# Patient Record
Sex: Female | Born: 1985 | Hispanic: No | Marital: Married | Smoking: Never smoker
Health system: Southern US, Community
[De-identification: ages and names within clinical notes are randomized; demographics above are authoritative.]

## PROBLEM LIST (undated history)

## (undated) ENCOUNTER — Inpatient Hospital Stay (HOSPITAL_COMMUNITY): Payer: Self-pay

## (undated) DIAGNOSIS — Z789 Other specified health status: Secondary | ICD-10-CM

## (undated) HISTORY — PX: NO PAST SURGERIES: SHX2092

---

## 2011-08-21 ENCOUNTER — Other Ambulatory Visit (HOSPITAL_COMMUNITY)
Admission: RE | Admit: 2011-08-21 | Discharge: 2011-08-21 | Disposition: A | Discharge status: Home / Self Care | Payer: Medicaid Other | Source: Ambulatory Visit | Attending: Obstetrics & Gynecology | Admitting: Obstetrics & Gynecology

## 2011-08-21 DIAGNOSIS — Z124 Encounter for screening for malignant neoplasm of cervix: Secondary | ICD-10-CM | POA: Insufficient documentation

## 2011-08-21 DIAGNOSIS — Z3480 Encounter for supervision of other normal pregnancy, unspecified trimester: Secondary | ICD-10-CM | POA: Insufficient documentation

## 2011-08-21 DIAGNOSIS — Z113 Encounter for screening for infections with a predominantly sexual mode of transmission: Secondary | ICD-10-CM | POA: Insufficient documentation

## 2011-12-14 LAB — OB RESULTS CONSOLE GC/CHLAMYDIA: Chlamydia: NEGATIVE

## 2011-12-14 LAB — OB RESULTS CONSOLE RPR: RPR: NONREACTIVE

## 2011-12-14 LAB — OB RESULTS CONSOLE ABO/RH: RH Type: POSITIVE

## 2011-12-14 LAB — OB RESULTS CONSOLE HIV ANTIBODY (ROUTINE TESTING): HIV: NONREACTIVE

## 2012-02-05 ENCOUNTER — Inpatient Hospital Stay (HOSPITAL_COMMUNITY): Payer: Medicaid Other

## 2012-02-05 ENCOUNTER — Encounter (HOSPITAL_COMMUNITY): Payer: Self-pay | Admitting: *Deleted

## 2012-02-05 ENCOUNTER — Inpatient Hospital Stay (HOSPITAL_COMMUNITY)
Admission: AD | Admit: 2012-02-05 | Discharge: 2012-02-05 | Disposition: A | Discharge status: Home / Self Care | Payer: Medicaid Other | Source: Ambulatory Visit | Attending: Obstetrics | Admitting: Obstetrics

## 2012-02-05 DIAGNOSIS — O368190 Decreased fetal movements, unspecified trimester, not applicable or unspecified: Secondary | ICD-10-CM | POA: Insufficient documentation

## 2012-02-05 HISTORY — DX: Other specified health status: Z78.9

## 2012-02-05 NOTE — MAU Note (Signed)
Pt is still in  U/s;

## 2012-02-05 NOTE — MAU Provider Note (Signed)
  History     CSN: [id number]  Arrival date and time: 02/05/12 1147   None     Chief Complaint  Patient presents with  . Decreased Fetal Movement   HPI  pt is here from the office for decreased fetal movement.  Past Medical History  Diagnosis Date  . No pertinent past medical history     Past Surgical History  Procedure Date  . No past surgeries     Family History  Problem Relation Age of Onset  . Other Neg Hx     History  Substance Use Topics  . Smoking status: Never Smoker   . Smokeless tobacco: Not on file  . Alcohol Use: No    Allergies: Allergies not on file  No prescriptions prior to admission    Review of Systems  Constitutional: Negative for fever.  Eyes: Negative for blurred vision.  Gastrointestinal: Negative for nausea, vomiting, abdominal pain, diarrhea and constipation.  Genitourinary: Negative for dysuria, urgency and frequency.  Neurological: Negative for dizziness and headaches.   Physical Exam   Blood pressure 124/68, pulse 85, temperature 97.9 F (36.6 C), resp. rate 16, height 5\' 5"  (1.651 m), weight 69.31 kg (152 lb 12.8 oz).  Physical Exam  Nursing note and vitals reviewed. Constitutional: She is oriented to person, place, and time. She appears well-developed and well-nourished. No distress.  Cardiovascular: Normal rate.   GI: Soft.  Genitourinary:         FHT: 135, moderate variability with 15X15 accels. No Decels Toco: irregular UC's (2 in 20 min). No noticed by patient.   Neurological: She is alert and oriented to person, place, and time.  Skin: Skin is warm and dry.  Psychiatric: She has a normal mood and affect.    MAU Course  Procedures  1228: Spoke with Dr. Clearance Coots, NST reactive. Orders for BPP with AFI and growth.     0206: Spoke with Dr. Clearance Coots, pt is OK for DC home. FU with the office in 1 week.   Assessment and Plan   1. Decreased fetal movement in pregnancy, antepartum    Reassuring antenatal  testing 3rd trimester danger signs reviewed FU with Dr. Clearance Coots in 1 week  Tawnya Crook 02/05/2012, 12:27 PM

## 2012-02-05 NOTE — MAU Note (Signed)
No fetal movement since yesterday, seen @ MD office, FHR was good.  Sent from MD office to MAU.  Mild uc's, denies bleeding or LOF.

## 2012-02-08 ENCOUNTER — Inpatient Hospital Stay (HOSPITAL_COMMUNITY)
Admission: AD | Admit: 2012-02-08 | Discharge: 2012-02-08 | Disposition: A | Discharge status: Home / Self Care | Payer: Medicaid Other | Source: Ambulatory Visit | Attending: Obstetrics | Admitting: Obstetrics

## 2012-02-08 ENCOUNTER — Encounter (HOSPITAL_COMMUNITY): Payer: Self-pay | Admitting: *Deleted

## 2012-02-08 DIAGNOSIS — O4700 False labor before 37 completed weeks of gestation, unspecified trimester: Secondary | ICD-10-CM | POA: Insufficient documentation

## 2012-02-08 DIAGNOSIS — O479 False labor, unspecified: Secondary | ICD-10-CM

## 2012-02-08 NOTE — MAU Provider Note (Signed)
  History     CSN: [id number]  Arrival date and time: 02/08/12 1653   First Provider Initiated Contact with Patient 02/08/12 1824      Chief Complaint  Patient presents with  . Abdominal Pain  . Back Pain   HPI This is a 26 y.o. female at [redacted]w[redacted]d who presents with report of contractions off and on all day. Since 3pm they have been more frequent.   OB History    Grav Para Term Preterm Abortions TAB SAB Ect Mult Living   1               Past Medical History  Diagnosis Date  . No pertinent past medical history     Past Surgical History  Procedure Date  . No past surgeries     Family History  Problem Relation Age of Onset  . Other Neg Hx     History  Substance Use Topics  . Smoking status: Never Smoker   . Smokeless tobacco: Not on file  . Alcohol Use: No    Allergies: No Known Allergies  Prescriptions prior to admission  Medication Sig Dispense Refill  . Prenatal Vit-Fe Fumarate-FA (PRENATAL MULTIVITAMIN) TABS Take 1 tablet by mouth every morning.        ROS See HPI  Physical Exam   Blood pressure 114/79, pulse 106, temperature 97.2 F (36.2 C), temperature source Oral, resp. rate 18.  Physical Exam  Constitutional: She is oriented to person, place, and time. She appears well-developed and well-nourished. No distress.  Cardiovascular: Normal rate.   Respiratory: Effort normal.  GI: Soft. She exhibits no distension and no mass. There is no tenderness. There is no rebound and no guarding.  Genitourinary: Vagina normal and uterus normal. No vaginal discharge found.  Musculoskeletal: Normal range of motion.  Neurological: She is alert and oriented to person, place, and time.  Skin: Skin is warm and dry.  Psychiatric: She has a normal mood and affect.   FHR reactive UCs irregular, some every 5 minutes, some further apart, states are not painful  Cervix long and closed  MAU Course  Procedures   Assessment and Plan  A:  SIUP at [redacted]w[redacted]d       Preterm  contractions with no cervical change  P:  Discussed with Dr Octavia Heir      Offered tocolysis for comfort, but she declined      Labor precautions      Follow with Dr Romana Juniper 02/08/2012, 6:51 PM

## 2012-02-08 NOTE — MAU Note (Signed)
C/O pain that starts in her lower back L side & radiates around to the L side of her abdomen.  Has been constant since 1430.  Pt denies bleeding or LOF.

## 2012-02-12 LAB — OB RESULTS CONSOLE GBS: GBS: NEGATIVE

## 2012-03-14 ENCOUNTER — Encounter (HOSPITAL_COMMUNITY): Payer: Self-pay | Admitting: *Deleted

## 2012-03-14 ENCOUNTER — Telehealth (HOSPITAL_COMMUNITY): Payer: Self-pay | Admitting: *Deleted

## 2012-03-14 NOTE — Telephone Encounter (Signed)
Preadmission screen  

## 2012-03-16 ENCOUNTER — Encounter (HOSPITAL_COMMUNITY): Payer: Self-pay | Admitting: *Deleted

## 2012-03-16 ENCOUNTER — Inpatient Hospital Stay (HOSPITAL_COMMUNITY)
Admission: AD | Admit: 2012-03-16 | Discharge: 2012-03-16 | Disposition: A | Discharge status: Home / Self Care | Payer: Medicaid Other | Source: Ambulatory Visit | Attending: Obstetrics & Gynecology | Admitting: Obstetrics & Gynecology

## 2012-03-16 DIAGNOSIS — O479 False labor, unspecified: Secondary | ICD-10-CM | POA: Insufficient documentation

## 2012-03-16 NOTE — MAU Provider Note (Signed)
27 y.o. G1P0 at [redacted]w[redacted]d here with possible SROM. RN requests exam for r/o ROM only.   O:  Spec: no pooling Fern negative  EFM reactive, TOCO irreg  RN to report exam to Dr. Tamela Oddi for POC

## 2012-03-16 NOTE — MAU Note (Signed)
Been leaking fluid and having vaginal bleeding lighter than a period since yesterday. Started having contractions this morning around 2 am.

## 2012-03-17 ENCOUNTER — Inpatient Hospital Stay (HOSPITAL_COMMUNITY)
Admission: AD | Admit: 2012-03-17 | Discharge: 2012-03-22 | DRG: 765 | Disposition: A | Discharge status: Home / Self Care | Payer: Medicaid Other | Source: Ambulatory Visit | Attending: Obstetrics & Gynecology | Admitting: Obstetrics & Gynecology

## 2012-03-17 ENCOUNTER — Encounter (HOSPITAL_COMMUNITY): Payer: Self-pay | Admitting: *Deleted

## 2012-03-17 DIAGNOSIS — O4290 Premature rupture of membranes, unspecified as to length of time between rupture and onset of labor, unspecified weeks of gestation: Principal | ICD-10-CM | POA: Diagnosis present

## 2012-03-17 DIAGNOSIS — D649 Anemia, unspecified: Secondary | ICD-10-CM | POA: Diagnosis not present

## 2012-03-17 DIAGNOSIS — O8612 Endometritis following delivery: Secondary | ICD-10-CM | POA: Diagnosis not present

## 2012-03-17 DIAGNOSIS — O9903 Anemia complicating the puerperium: Secondary | ICD-10-CM | POA: Diagnosis not present

## 2012-03-17 DIAGNOSIS — Z98891 History of uterine scar from previous surgery: Secondary | ICD-10-CM

## 2012-03-17 LAB — POCT FERN TEST: POCT Fern Test: POSITIVE

## 2012-03-17 LAB — CBC
Hemoglobin: 13.2 g/dL (ref 12.0–15.0)
Platelets: 196 10*3/uL (ref 150–400)
RBC: 4.85 MIL/uL (ref 3.87–5.11)
WBC: 11 10*3/uL — ABNORMAL HIGH (ref 4.0–10.5)

## 2012-03-17 MED ORDER — FLEET ENEMA 7-19 GM/118ML RE ENEM: 1.0000 | ENEMA | RECTAL | Status: DC | PRN

## 2012-03-17 MED ORDER — ACETAMINOPHEN 325 MG PO TABS
650.0000 mg | ORAL_TABLET | ORAL | Status: DC | PRN
  Administered 2012-03-18: 650 mg via ORAL
  Filled 2012-03-17: qty 2

## 2012-03-17 MED ORDER — IBUPROFEN 600 MG PO TABS: 600.0000 mg | ORAL_TABLET | Freq: Four times a day (QID) | ORAL | Status: DC | PRN

## 2012-03-17 MED ORDER — ONDANSETRON HCL 4 MG/2ML IJ SOLN: 4.0000 mg | Freq: Four times a day (QID) | INTRAMUSCULAR | Status: DC | PRN

## 2012-03-17 MED ORDER — OXYTOCIN 40 UNITS IN LACTATED RINGERS INFUSION - SIMPLE MED
1.0000 m[IU]/min | INTRAVENOUS | Status: DC
  Administered 2012-03-17: 2 m[IU]/min via INTRAVENOUS
  Filled 2012-03-17: qty 1000

## 2012-03-17 MED ORDER — OXYTOCIN 40 UNITS IN LACTATED RINGERS INFUSION - SIMPLE MED: 62.5000 mL/h | INTRAVENOUS | Status: DC

## 2012-03-17 MED ORDER — TERBUTALINE SULFATE 1 MG/ML IJ SOLN: 0.2500 mg | Freq: Once | INTRAMUSCULAR | Status: AC | PRN

## 2012-03-17 MED ORDER — LIDOCAINE HCL (PF) 1 % IJ SOLN
30.0000 mL | INTRAMUSCULAR | Status: DC | PRN
  Filled 2012-03-17: qty 30

## 2012-03-17 MED ORDER — CITRIC ACID-SODIUM CITRATE 334-500 MG/5ML PO SOLN
30.0000 mL | ORAL | Status: DC | PRN
  Administered 2012-03-19: 30 mL via ORAL
  Filled 2012-03-17: qty 15

## 2012-03-17 MED ORDER — OXYTOCIN BOLUS FROM INFUSION: 500.0000 mL | INTRAVENOUS | Status: DC

## 2012-03-17 MED ORDER — OXYCODONE-ACETAMINOPHEN 5-325 MG PO TABS: 1.0000 | ORAL_TABLET | ORAL | Status: DC | PRN

## 2012-03-17 MED ORDER — LACTATED RINGERS IV SOLN
INTRAVENOUS | Status: DC
  Administered 2012-03-17 – 2012-03-18 (×3): via INTRAVENOUS

## 2012-03-17 MED ORDER — BUTORPHANOL TARTRATE 2 MG/ML IJ SOLN
2.0000 mg | INTRAMUSCULAR | Status: DC | PRN
  Administered 2012-03-18: 2 mg via INTRAVENOUS
  Filled 2012-03-17: qty 2

## 2012-03-17 MED ORDER — LACTATED RINGERS IV SOLN: 500.0000 mL | INTRAVENOUS | Status: DC | PRN

## 2012-03-17 NOTE — MAU Note (Signed)
Leaking yellow/clear fluid since this morning. Pink spotting since yesterday. Contractions every 30 minutes all day. Positive fetal movement.

## 2012-03-17 NOTE — H&P (Signed)
Rachel Chavez is a 27 y.o. female presenting for rupture of membranes. Maternal Medical History:  Reason for admission: Reason for admission: rupture of membranes.  Fetal activity: Perceived fetal activity is normal.    Prenatal complications: no prenatal complications   OB History    Grav Para Term Preterm Abortions TAB SAB Ect Mult Living   1              Past Medical History  Diagnosis Date  . No pertinent past medical history    Past Surgical History  Procedure Date  . No past surgeries    Family History: family history is negative for Other. Social History:  reports that she has never smoked. She has never used smokeless tobacco. She reports that she does not drink alcohol or use illicit drugs.     Review of Systems  Constitutional: Negative for fever.  Eyes: Negative for blurred vision.  Respiratory: Negative for shortness of breath.   Gastrointestinal: Negative for vomiting.  Skin: Negative for rash.  Neurological: Negative for headaches.    Dilation: Fingertip Effacement (%): 50 Station: Ballotable Exam by:: [employer] portable bedside US Blood pressure 123/77, pulse 100, temperature 97.9 F (36.6 C), temperature source Oral, resp. rate 18, height 5\' 4"  (1.626 m), weight 71.124 kg (156 lb 12.8 oz), SpO2 99.00%. Maternal Exam:  Abdomen: Fetal presentation: vertex  Introitus: Ferning test: positive.  Amniotic fluid character: meconium stained.  Cervix: Cervix evaluated by digital exam.     Fetal Exam Fetal Monitor Review: Variability: moderate (6-25 bpm).   Pattern: accelerations present and no decelerations.    Fetal State Assessment: Category I - tracings are normal.     Physical Exam  Constitutional: She appears well-developed.  HENT:  Head: Normocephalic.  Neck: Neck supple. No thyromegaly present.  Cardiovascular: Normal rate and regular rhythm.   Respiratory: Breath sounds normal.  GI: Soft. Bowel sounds are normal.  Skin: No  rash noted.    Prenatal labs: ABO, Rh: B/Positive/-- (10/17 0000) Antibody: Negative (10/17 0000) Rubella: Immune (10/17 0000) RPR: Nonreactive (10/17 0000)  HBsAg: Negative (10/17 0000)  HIV: Non-reactive (10/17 0000)  GBS: Negative (12/16 0000)   Assessment/Plan: [place] @ [redacted]w[redacted]d.  PROM. ?Meconium staining.  Category I FHT.  Admit Low dose Pitocin per protocol  [name],Maks Cavallero A 03/17/2012, 10:15 PM

## 2012-03-18 ENCOUNTER — Inpatient Hospital Stay (HOSPITAL_COMMUNITY): Admission: RE | Admit: 2012-03-18 | Payer: Medicaid Other | Source: Ambulatory Visit

## 2012-03-18 LAB — TYPE AND SCREEN
ABO/RH(D): B POS
Antibody Screen: NEGATIVE

## 2012-03-18 LAB — RPR: RPR Ser Ql: NONREACTIVE

## 2012-03-18 LAB — ABO/RH: ABO/RH(D): B POS

## 2012-03-18 MED ORDER — LIDOCAINE HCL (PF) 1 % IJ SOLN
INTRAMUSCULAR | Status: DC | PRN
  Administered 2012-03-18 (×2): 5 mL

## 2012-03-18 MED ORDER — TERBUTALINE SULFATE 1 MG/ML IJ SOLN: 0.2500 mg | Freq: Once | INTRAMUSCULAR | Status: AC | PRN

## 2012-03-18 MED ORDER — LACTATED RINGERS IV SOLN
INTRAVENOUS | Status: DC
  Administered 2012-03-19: 09:00:00 via INTRAVENOUS

## 2012-03-18 MED ORDER — EPHEDRINE 5 MG/ML INJ: 10.0000 mg | INTRAVENOUS | Status: DC | PRN

## 2012-03-18 MED ORDER — DIPHENHYDRAMINE HCL 50 MG/ML IJ SOLN: 12.5000 mg | INTRAMUSCULAR | Status: DC | PRN

## 2012-03-18 MED ORDER — GENTAMICIN SULFATE 40 MG/ML IJ SOLN
150.0000 mg | Freq: Three times a day (TID) | INTRAVENOUS | Status: DC
  Administered 2012-03-18 – 2012-03-19 (×2): 150 mg via INTRAVENOUS
  Filled 2012-03-18 (×3): qty 3.75

## 2012-03-18 MED ORDER — SODIUM CHLORIDE 0.9 % IV SOLN
2.0000 g | Freq: Four times a day (QID) | INTRAVENOUS | Status: DC
  Administered 2012-03-18 – 2012-03-22 (×15): 2 g via INTRAVENOUS
  Filled 2012-03-18 (×17): qty 2000

## 2012-03-18 MED ORDER — PHENYLEPHRINE 40 MCG/ML (10ML) SYRINGE FOR IV PUSH (FOR BLOOD PRESSURE SUPPORT)
80.0000 ug | PREFILLED_SYRINGE | INTRAVENOUS | Status: DC | PRN
  Filled 2012-03-18: qty 5

## 2012-03-18 MED ORDER — OXYTOCIN 40 UNITS IN LACTATED RINGERS INFUSION - SIMPLE MED: 1.0000 m[IU]/min | INTRAVENOUS | Status: DC

## 2012-03-18 MED ORDER — LACTATED RINGERS IV SOLN: 500.0000 mL | Freq: Once | INTRAVENOUS | Status: DC

## 2012-03-18 MED ORDER — PHENYLEPHRINE 40 MCG/ML (10ML) SYRINGE FOR IV PUSH (FOR BLOOD PRESSURE SUPPORT): 80.0000 ug | PREFILLED_SYRINGE | INTRAVENOUS | Status: DC | PRN

## 2012-03-18 MED ORDER — FENTANYL 2.5 MCG/ML BUPIVACAINE 1/10 % EPIDURAL INFUSION (WH - ANES)
14.0000 mL/h | INTRAMUSCULAR | Status: DC
  Administered 2012-03-18 – 2012-03-19 (×3): 14 mL/h via EPIDURAL
  Filled 2012-03-18 (×3): qty 125

## 2012-03-18 MED ORDER — EPHEDRINE 5 MG/ML INJ
10.0000 mg | INTRAVENOUS | Status: DC | PRN
  Filled 2012-03-18: qty 4

## 2012-03-18 NOTE — Progress Notes (Signed)
Called Dr. Tamela Oddi to give status update.  Notified of FHR status, Pitocin 16 mu/min, vital signs, and UC pattern.  Order rec'd to D/C Pitocin and then restart at 1750, and for no SVE to be done at present time.Marland Kitchen

## 2012-03-18 NOTE — Progress Notes (Signed)
Rachel Chavez is Chavez 27 y.o. G1P0 at [redacted]w[redacted]d by LMP admitted for rupture of membranes  Subjective: Uncomfortable  Objective: BP 132/81  Pulse 113  Temp 98.2 F (36.8 C) (Oral)  Resp 18  Ht 5\' 4"  (1.626 m)  Wt 71.124 kg (156 lb 12.8 oz)  BMI 26.91 kg/m2  SpO2 99%      FHT:  FHR: 140 bpm, variability: moderate,  accelerations:  Present,  decelerations:  Absent UC:   regular, every 2 minutes SVE:   Dilation: 1.5 Effacement (%): 50 Station: -3 Exam by:: APannell,RN  Labs: Lab Results  Component Value Date   WBC 11.0* 03/17/2012   HGB 13.2 03/17/2012   HCT 38.9 03/17/2012   MCV 80.2 03/17/2012   PLT 196 03/17/2012    Assessment / Plan: Early labor  Labor: see above Preeclampsia:  n/Chavez Fetal Wellbeing:  Category I Pain Control:  an Epidural is planned I/D:  no signs/symptoms of chorioamnionitis Anticipated MOD:  NSVD  Rachel Chavez 03/18/2012, 11:19 AM

## 2012-03-18 NOTE — Anesthesia Procedure Notes (Signed)
Epidural Patient location during procedure: OB Start time: 03/18/2012 11:29 AM  Staffing Anesthesiologist: Angus Seller., Harrell Gave. Performed by: anesthesiologist   Preanesthetic Checklist Completed: patient identified, site marked, surgical consent, pre-op evaluation, timeout performed, IV checked, risks and benefits discussed and monitors and equipment checked  Epidural Patient position: sitting Prep: site prepped and draped and DuraPrep Patient monitoring: continuous pulse ox and blood pressure Approach: midline Injection technique: LOR air and LOR saline  Needle:  Needle type: Tuohy  Needle gauge: 17 G Needle length: 9 cm and 9 Needle insertion depth: 5 cm cm Catheter type: closed end flexible Catheter size: 19 Gauge Catheter at skin depth: 10 cm Test dose: negative  Assessment Events: blood not aspirated, injection not painful, no injection resistance, negative IV test and no paresthesia  Additional Notes Patient identified.  Risk benefits discussed including failed block, incomplete pain control, headache, nerve damage, paralysis, blood pressure changes, nausea, vomiting, reactions to medication both toxic or allergic, and postpartum back pain.  Patient expressed understanding and wished to proceed.  All questions were answered.  Sterile technique used throughout procedure and epidural site dressed with sterile barrier dressing. No paresthesia or other complications noted.The patient did not experience any signs of intravascular injection such as tinnitus or metallic taste in mouth nor signs of intrathecal spread such as rapid motor block. Please see nursing notes for vital signs.

## 2012-03-18 NOTE — Anesthesia Preprocedure Evaluation (Signed)
Anesthesia Evaluation  Patient identified by MRN, date of birth, ID band Patient awake    Reviewed: Allergy & Precautions, H&P , Patient's Chart, lab work & pertinent test results  Airway Mallampati: II TM Distance: >3 FB Neck ROM: full    Dental No notable dental hx.    Pulmonary neg pulmonary ROS,  breath sounds clear to auscultation  Pulmonary exam normal       Cardiovascular negative cardio ROS  Rhythm:regular Rate:Normal     Neuro/Psych negative neurological ROS  negative psych ROS   GI/Hepatic negative GI ROS, Neg liver ROS,   Endo/Other  negative endocrine ROS  Renal/GU negative Renal ROS     Musculoskeletal   Abdominal   Peds  Hematology negative hematology ROS (+)   Anesthesia Other Findings   Reproductive/Obstetrics (+) Pregnancy                           Anesthesia Physical Anesthesia Plan  ASA: II  Anesthesia Plan: Epidural   Post-op Pain Management:    Induction:   Airway Management Planned:   Additional Equipment:   Intra-op Plan:   Post-operative Plan:   Informed Consent: I have reviewed the patients History and Physical, chart, labs and discussed the procedure including the risks, benefits and alternatives for the proposed anesthesia with the patient or authorized representative who has indicated his/her understanding and acceptance.     Plan Discussed with:   Anesthesia Plan Comments:         Anesthesia Quick Evaluation

## 2012-03-18 NOTE — Progress Notes (Signed)
ANTIBIOTIC CONSULT NOTE - INITIAL  Pharmacy Consult for Gentamicin Indication: Maternal temp during labor  No Known Allergies  Patient Measurements: Height: 5\' 4"  (162.6 cm) Weight: 156 lb 12.8 oz (71.124 kg) IBW/kg (Calculated) : 54.7  Adjusted Body Weight: 59.6kg  Vital Signs: Temp: 99.1 F (37.3 C) (01/20 1858) Temp src: Oral (01/20 1858) BP: 118/58 mmHg (01/20 1830) Pulse Rate: 117  (01/20 1830)   Labs:  Basename 03/17/12 2215  WBC 11.0*  HGB 13.2  PLT 196  LABCREA --  CREATININE --   Estimated SCr=0.65 with estimated CrCl= > 143ml/min   Microbiology: No results found for this or any previous visit (from the past 720 hour(s)).  Medical History: Past Medical History  Diagnosis Date  . No pertinent past medical history     Medications:  Ampicillin 2 grams IV q6h Assessment: 27yo F 40+ admitted for ROM now with maternal temp during labor.  Goal of Therapy:  Gentamicin peaks 6-34mcg/ml and trough < 28mcg/ml  Plan:  1. Gentamicin 150mg  IV q8h. 2. If Gentamicin continued postpartum, will draw SCr. 3. Will continue to follow.  Claybon Jabs 03/18/2012,7:00 PM

## 2012-03-19 ENCOUNTER — Inpatient Hospital Stay (HOSPITAL_COMMUNITY): Payer: Medicaid Other | Admitting: Anesthesiology

## 2012-03-19 ENCOUNTER — Encounter (HOSPITAL_COMMUNITY)
Admission: AD | Disposition: A | Discharge status: Home / Self Care | Payer: Self-pay | Source: Ambulatory Visit | Attending: Obstetrics & Gynecology

## 2012-03-19 ENCOUNTER — Encounter (HOSPITAL_COMMUNITY): Payer: Self-pay | Admitting: Anesthesiology

## 2012-03-19 ENCOUNTER — Encounter (HOSPITAL_COMMUNITY): Payer: Self-pay | Admitting: *Deleted

## 2012-03-19 LAB — CREATININE, SERUM
Creatinine, Ser: 0.52 mg/dL (ref 0.50–1.10)
GFR calc non Af Amer: >90 mL/min (ref >90)

## 2012-03-19 SURGERY — Surgical Case
Anesthesia: Regional | Site: Abdomen | Wound class: Clean Contaminated

## 2012-03-19 MED ORDER — NALBUPHINE SYRINGE 5 MG/0.5 ML
5.0000 mg | INJECTION | INTRAMUSCULAR | Status: DC | PRN
  Filled 2012-03-19: qty 1

## 2012-03-19 MED ORDER — MORPHINE SULFATE (PF) 0.5 MG/ML IJ SOLN
INTRAMUSCULAR | Status: DC | PRN
  Administered 2012-03-19: 2000 ug via INTRAVENOUS
  Administered 2012-03-19: 3000 ug via EPIDURAL

## 2012-03-19 MED ORDER — SIMETHICONE 80 MG PO CHEW: 80.0000 mg | CHEWABLE_TABLET | ORAL | Status: DC | PRN

## 2012-03-19 MED ORDER — GENTAMICIN SULFATE 40 MG/ML IJ SOLN
150.0000 mg | Freq: Three times a day (TID) | INTRAVENOUS | Status: AC
  Administered 2012-03-19 – 2012-03-20 (×4): 150 mg via INTRAVENOUS
  Filled 2012-03-19 (×4): qty 3.75

## 2012-03-19 MED ORDER — SCOPOLAMINE 1 MG/3DAYS TD PT72
1.0000 | MEDICATED_PATCH | Freq: Once | TRANSDERMAL | Status: DC
  Administered 2012-03-19: 1.5 mg via TRANSDERMAL

## 2012-03-19 MED ORDER — KETOROLAC TROMETHAMINE 60 MG/2ML IM SOLN
INTRAMUSCULAR | Status: AC
  Administered 2012-03-19: 60 mg via INTRAMUSCULAR
  Filled 2012-03-19: qty 2

## 2012-03-19 MED ORDER — MENTHOL 3 MG MT LOZG: 1.0000 | LOZENGE | OROMUCOSAL | Status: DC | PRN

## 2012-03-19 MED ORDER — ONDANSETRON HCL 4 MG/2ML IJ SOLN: 4.0000 mg | INTRAMUSCULAR | Status: DC | PRN

## 2012-03-19 MED ORDER — TETANUS-DIPHTH-ACELL PERTUSSIS 5-2.5-18.5 LF-MCG/0.5 IM SUSP: 0.5000 mL | Freq: Once | INTRAMUSCULAR | Status: DC

## 2012-03-19 MED ORDER — NALOXONE HCL 1 MG/ML IJ SOLN
1.0000 ug/kg/h | INTRAVENOUS | Status: DC | PRN
  Filled 2012-03-19: qty 2

## 2012-03-19 MED ORDER — MEPERIDINE HCL 25 MG/ML IJ SOLN
INTRAMUSCULAR | Status: DC | PRN
  Administered 2012-03-19 (×2): 12.5 mg via INTRAVENOUS

## 2012-03-19 MED ORDER — DIPHENHYDRAMINE HCL 50 MG/ML IJ SOLN: 25.0000 mg | INTRAMUSCULAR | Status: DC | PRN

## 2012-03-19 MED ORDER — DIPHENHYDRAMINE HCL 50 MG/ML IJ SOLN: 12.5000 mg | INTRAMUSCULAR | Status: DC | PRN

## 2012-03-19 MED ORDER — SODIUM CHLORIDE 0.9 % IJ SOLN: 3.0000 mL | INTRAMUSCULAR | Status: DC | PRN

## 2012-03-19 MED ORDER — LANOLIN HYDROUS EX OINT: 1.0000 "application " | TOPICAL_OINTMENT | CUTANEOUS | Status: DC | PRN

## 2012-03-19 MED ORDER — LIDOCAINE-EPINEPHRINE (PF) 2 %-1:200000 IJ SOLN
INTRAMUSCULAR | Status: AC
  Filled 2012-03-19: qty 20

## 2012-03-19 MED ORDER — KETOROLAC TROMETHAMINE 30 MG/ML IJ SOLN: 30.0000 mg | Freq: Four times a day (QID) | INTRAMUSCULAR | Status: DC | PRN

## 2012-03-19 MED ORDER — AMPICILLIN SODIUM 2 G IJ SOLR: 2.0000 g | Freq: Four times a day (QID) | INTRAMUSCULAR | Status: DC

## 2012-03-19 MED ORDER — HYDROMORPHONE HCL PF 1 MG/ML IJ SOLN: 0.2500 mg | INTRAMUSCULAR | Status: DC | PRN

## 2012-03-19 MED ORDER — SENNOSIDES-DOCUSATE SODIUM 8.6-50 MG PO TABS
2.0000 | ORAL_TABLET | Freq: Every day | ORAL | Status: DC
  Administered 2012-03-19 – 2012-03-21 (×3): 2 via ORAL

## 2012-03-19 MED ORDER — OXYTOCIN 10 UNIT/ML IJ SOLN
40.0000 [IU] | INTRAVENOUS | Status: DC | PRN
  Administered 2012-03-19: 40 [IU] via INTRAVENOUS

## 2012-03-19 MED ORDER — MORPHINE SULFATE 0.5 MG/ML IJ SOLN
INTRAMUSCULAR | Status: AC
  Filled 2012-03-19: qty 10

## 2012-03-19 MED ORDER — OXYTOCIN 10 UNIT/ML IJ SOLN
INTRAMUSCULAR | Status: AC
  Filled 2012-03-19: qty 4

## 2012-03-19 MED ORDER — SODIUM BICARBONATE 8.4 % IV SOLN
INTRAVENOUS | Status: DC | PRN
  Administered 2012-03-19: 5 mL via EPIDURAL

## 2012-03-19 MED ORDER — PHENYLEPHRINE 40 MCG/ML (10ML) SYRINGE FOR IV PUSH (FOR BLOOD PRESSURE SUPPORT)
PREFILLED_SYRINGE | INTRAVENOUS | Status: AC
  Filled 2012-03-19: qty 5

## 2012-03-19 MED ORDER — MEPERIDINE HCL 25 MG/ML IJ SOLN
INTRAMUSCULAR | Status: AC
  Filled 2012-03-19: qty 1

## 2012-03-19 MED ORDER — ONDANSETRON HCL 4 MG/2ML IJ SOLN
INTRAMUSCULAR | Status: DC | PRN
  Administered 2012-03-19: 4 mg via INTRAVENOUS

## 2012-03-19 MED ORDER — PROMETHAZINE HCL 25 MG/ML IJ SOLN: 6.2500 mg | INTRAMUSCULAR | Status: DC | PRN

## 2012-03-19 MED ORDER — IBUPROFEN 600 MG PO TABS
600.0000 mg | ORAL_TABLET | Freq: Four times a day (QID) | ORAL | Status: DC
  Administered 2012-03-19 – 2012-03-22 (×12): 600 mg via ORAL
  Filled 2012-03-19 (×12): qty 1

## 2012-03-19 MED ORDER — SCOPOLAMINE 1 MG/3DAYS TD PT72
MEDICATED_PATCH | TRANSDERMAL | Status: AC
  Administered 2012-03-19: 1.5 mg via TRANSDERMAL
  Filled 2012-03-19: qty 1

## 2012-03-19 MED ORDER — LACTATED RINGERS IV SOLN
INTRAVENOUS | Status: DC
  Administered 2012-03-19 (×2): via INTRAVENOUS

## 2012-03-19 MED ORDER — INFLUENZA VIRUS VACC SPLIT PF IM SUSP
0.5000 mL | INTRAMUSCULAR | Status: AC
  Administered 2012-03-20: 0.5 mL via INTRAMUSCULAR
  Filled 2012-03-19: qty 0.5

## 2012-03-19 MED ORDER — PRENATAL MULTIVITAMIN CH
1.0000 | ORAL_TABLET | Freq: Every day | ORAL | Status: DC
  Administered 2012-03-20 – 2012-03-22 (×3): 1 via ORAL
  Filled 2012-03-19 (×3): qty 1

## 2012-03-19 MED ORDER — ONDANSETRON HCL 4 MG/2ML IJ SOLN: 4.0000 mg | Freq: Three times a day (TID) | INTRAMUSCULAR | Status: DC | PRN

## 2012-03-19 MED ORDER — OXYCODONE-ACETAMINOPHEN 5-325 MG PO TABS
1.0000 | ORAL_TABLET | ORAL | Status: DC | PRN
  Administered 2012-03-19 – 2012-03-21 (×4): 1 via ORAL
  Administered 2012-03-21: 2 via ORAL
  Administered 2012-03-22: 1 via ORAL
  Filled 2012-03-19 (×2): qty 1
  Filled 2012-03-19: qty 2
  Filled 2012-03-19 (×3): qty 1

## 2012-03-19 MED ORDER — KETOROLAC TROMETHAMINE 60 MG/2ML IM SOLN
60.0000 mg | Freq: Once | INTRAMUSCULAR | Status: AC | PRN
  Administered 2012-03-19: 60 mg via INTRAMUSCULAR

## 2012-03-19 MED ORDER — ZOLPIDEM TARTRATE 5 MG PO TABS: 5.0000 mg | ORAL_TABLET | Freq: Every evening | ORAL | Status: DC | PRN

## 2012-03-19 MED ORDER — DIBUCAINE 1 % RE OINT: 1.0000 "application " | TOPICAL_OINTMENT | RECTAL | Status: DC | PRN

## 2012-03-19 MED ORDER — PANTOPRAZOLE SODIUM 20 MG PO TBEC
20.0000 mg | DELAYED_RELEASE_TABLET | Freq: Two times a day (BID) | ORAL | Status: DC
  Administered 2012-03-19 – 2012-03-22 (×2): 20 mg via ORAL
  Filled 2012-03-19 (×8): qty 1

## 2012-03-19 MED ORDER — METOCLOPRAMIDE HCL 5 MG/ML IJ SOLN: 10.0000 mg | Freq: Three times a day (TID) | INTRAMUSCULAR | Status: DC | PRN

## 2012-03-19 MED ORDER — KETOROLAC TROMETHAMINE 30 MG/ML IJ SOLN: 15.0000 mg | Freq: Once | INTRAMUSCULAR | Status: DC | PRN

## 2012-03-19 MED ORDER — ONDANSETRON HCL 4 MG/2ML IJ SOLN
INTRAMUSCULAR | Status: AC
  Filled 2012-03-19: qty 2

## 2012-03-19 MED ORDER — ONDANSETRON HCL 4 MG PO TABS: 4.0000 mg | ORAL_TABLET | ORAL | Status: DC | PRN

## 2012-03-19 MED ORDER — SODIUM BICARBONATE 8.4 % IV SOLN
INTRAVENOUS | Status: AC
  Filled 2012-03-19: qty 50

## 2012-03-19 MED ORDER — 0.9 % SODIUM CHLORIDE (POUR BTL) OPTIME
TOPICAL | Status: DC | PRN
  Administered 2012-03-19: 1000 mL

## 2012-03-19 MED ORDER — MEPERIDINE HCL 25 MG/ML IJ SOLN: 6.2500 mg | INTRAMUSCULAR | Status: DC | PRN

## 2012-03-19 MED ORDER — DIPHENHYDRAMINE HCL 25 MG PO CAPS
25.0000 mg | ORAL_CAPSULE | ORAL | Status: DC | PRN
  Filled 2012-03-19: qty 1

## 2012-03-19 MED ORDER — DIPHENHYDRAMINE HCL 25 MG PO CAPS: 25.0000 mg | ORAL_CAPSULE | Freq: Four times a day (QID) | ORAL | Status: DC | PRN

## 2012-03-19 MED ORDER — WITCH HAZEL-GLYCERIN EX PADS: 1.0000 "application " | MEDICATED_PAD | CUTANEOUS | Status: DC | PRN

## 2012-03-19 MED ORDER — SIMETHICONE 80 MG PO CHEW
80.0000 mg | CHEWABLE_TABLET | Freq: Three times a day (TID) | ORAL | Status: DC
  Administered 2012-03-19 – 2012-03-22 (×12): 80 mg via ORAL

## 2012-03-19 MED ORDER — NALOXONE HCL 0.4 MG/ML IJ SOLN: 0.4000 mg | INTRAMUSCULAR | Status: DC | PRN

## 2012-03-19 MED ORDER — OXYTOCIN 40 UNITS IN LACTATED RINGERS INFUSION - SIMPLE MED: 62.5000 mL/h | INTRAVENOUS | Status: AC

## 2012-03-19 MED ORDER — SIMETHICONE 80 MG PO CHEW: 80.0000 mg | CHEWABLE_TABLET | Freq: Four times a day (QID) | ORAL | Status: DC | PRN

## 2012-03-19 SURGICAL SUPPLY — 41 items
CANISTER WOUND CARE 500ML ATS (WOUND CARE) IMPLANT
CLOTH BEACON ORANGE TIMEOUT ST (SAFETY) ×2 IMPLANT
CONTAINER PREFILL 10% NBF 15ML (MISCELLANEOUS) ×4 IMPLANT
DERMABOND ADVANCED (GAUZE/BANDAGES/DRESSINGS) ×1
DERMABOND ADVANCED .7 DNX12 (GAUZE/BANDAGES/DRESSINGS) ×1 IMPLANT
DRAPE LG THREE QUARTER DISP (DRAPES) ×2 IMPLANT
DRSG OPSITE POSTOP 4X10 (GAUZE/BANDAGES/DRESSINGS) ×2 IMPLANT
DRSG VAC ATS LRG SENSATRAC (GAUZE/BANDAGES/DRESSINGS) IMPLANT
DRSG VAC ATS MED SENSATRAC (GAUZE/BANDAGES/DRESSINGS) IMPLANT
DRSG VAC ATS SM SENSATRAC (GAUZE/BANDAGES/DRESSINGS) IMPLANT
DURAPREP 26ML APPLICATOR (WOUND CARE) ×2 IMPLANT
ELECT REM PT RETURN 9FT ADLT (ELECTROSURGICAL) ×2
ELECTRODE REM PT RTRN 9FT ADLT (ELECTROSURGICAL) ×1 IMPLANT
EXTRACTOR VACUUM M CUP 4 TUBE (SUCTIONS) IMPLANT
GLOVE BIO SURGEON STRL SZ8 (GLOVE) ×4 IMPLANT
GOWN PREVENTION PLUS LG XLONG (DISPOSABLE) ×4 IMPLANT
GOWN PREVENTION PLUS XLARGE (GOWN DISPOSABLE) ×2 IMPLANT
KIT ABG SYR 3ML LUER SLIP (SYRINGE) IMPLANT
NEEDLE HYPO 25X5/8 SAFETYGLIDE (NEEDLE) ×2 IMPLANT
NS IRRIG 1000ML POUR BTL (IV SOLUTION) ×2 IMPLANT
PACK C SECTION WH (CUSTOM PROCEDURE TRAY) ×2 IMPLANT
PAD OB MATERNITY 4.3X12.25 (PERSONAL CARE ITEMS) ×2 IMPLANT
RTRCTR C-SECT PINK 25CM LRG (MISCELLANEOUS) ×2 IMPLANT
SLEEVE SCD COMPRESS KNEE MED (MISCELLANEOUS) IMPLANT
STAPLER VISISTAT 35W (STAPLE) ×2 IMPLANT
SUT GUT PLAIN 0 CT-3 TAN 27 (SUTURE) IMPLANT
SUT MNCRL 0 VIOLET CTX 36 (SUTURE) ×3 IMPLANT
SUT MNCRL AB 4-0 PS2 18 (SUTURE) IMPLANT
SUT MON AB 2-0 CT1 27 (SUTURE) ×2 IMPLANT
SUT MON AB 3-0 SH 27 (SUTURE)
SUT MON AB 3-0 SH27 (SUTURE) IMPLANT
SUT MONOCRYL 0 CTX 36 (SUTURE) ×3
SUT PDS AB 0 CTX 60 (SUTURE) IMPLANT
SUT PLAIN 2 0 XLH (SUTURE) IMPLANT
SUT VIC AB 0 CTX 36 (SUTURE) ×1
SUT VIC AB 0 CTX36XBRD ANBCTRL (SUTURE) ×1 IMPLANT
SUT VIC AB 2-0 CT1 27 (SUTURE)
SUT VIC AB 2-0 CT1 TAPERPNT 27 (SUTURE) IMPLANT
TOWEL OR 17X24 6PK STRL BLUE (TOWEL DISPOSABLE) ×6 IMPLANT
TRAY FOLEY CATH 14FR (SET/KITS/TRAYS/PACK) ×2 IMPLANT
WATER STERILE IRR 1000ML POUR (IV SOLUTION) ×2 IMPLANT

## 2012-03-19 NOTE — Op Note (Signed)
Cesarean Section Procedure Note   Rachel Chavez   03/17/2012 - 03/19/2012  Indications: Arrest of dilatation.   Pre-operative Diagnosis: arrest of dilatation.   Post-operative Diagnosis: Same   Surgeon: Dimitrios Balestrieri A  Assistants: Surgical Technician  Anesthesia: epidural  Procedure Details:  The patient was seen in the Holding Room. The risks, benefits, complications, treatment options, and expected outcomes were discussed with the patient. The patient concurred with the proposed plan, giving informed consent. The patient was identified as Rachel Chavez and the procedure verified as C-Section Delivery. A Time Out was held and the above information confirmed.  After induction of anesthesia, the patient was draped and prepped in the usual sterile manner. A transverse incision was made and carried down through the subcutaneous tissue to the fascia. The fascial incision was made and extended transversely. The fascia was separated from the underlying rectus tissue superiorly and inferiorly. The peritoneum was identified and entered. The peritoneal incision was extended longitudinally. The utero-vesical peritoneal reflection was incised transversely and the bladder flap was bluntly freed from the lower uterine segment. A low transverse uterine incision was made. Delivered from cephalic presentation was a 3630 gram living newborn female infant(s). APGAR (1 MIN): 9   APGAR (5 MINS): 9   APGAR (10 MINS):    A cord ph was not sent. The umbilical cord was clamped and cut cord. A sample was obtained for evaluation. The placenta was removed Intact and appeared normal.  The uterine incision was closed with running locked sutures of 0 Monocryl. A second imbricating layer of the same suture was placed.  Hemostasis was observed. The paracolic gutters were irrigated. The parieto peritoneum was closed in a running fashion with 2-0 Vicryl.  The fascia was then reapproximated with running sutures of 0 Vicryl.  The  skin was closed with staples.  Instrument, sponge, and needle counts were correct prior the abdominal closure and were correct at the conclusion of the case.    Findings:  Normal uterus, ovaries and fallopian tubes.   Estimated Blood Loss:  Total IV Fluids:   Urine Output: 350CC OF clear urine  Specimens: @ORSPECIMEN @   Complications: no complications  Disposition: PACU - hemodynamically stable.  Maternal Condition: stable   Baby condition / location:  nursery-stable    Signed: Surgeon(s): Brock Bad, MD

## 2012-03-19 NOTE — Progress Notes (Signed)
This note also relates to the following rows which could not be included: Dose (milli-units/min) Oxytocin - Cannot attach notes to extension rows Rate (mL/hr) Oxytocin - Cannot attach notes to extension rows Concentration Oxytocin - Cannot attach notes to extension rows   Left message with Dr. Clearance Coots that family is very concerned about baby and are demanding a c-section

## 2012-03-19 NOTE — Anesthesia Postprocedure Evaluation (Signed)
Anesthesia Post Note  Patient: Rachel Chavez  Procedure(s) Performed: Procedure(s) (LRB): CESAREAN SECTION (N/A)  Anesthesia type: Epidural  Patient location: PACU  Post pain: Pain level controlled  Post assessment: Post-op Vital signs reviewed  Last Vitals:  Filed Vitals:   03/19/12 1210  BP: 108/74  Pulse: 102  Temp: 36.7 C  Resp: 20    Post vital signs: Reviewed  Level of consciousness: awake  Complications: No apparent anesthesia complications

## 2012-03-19 NOTE — Transfer of Care (Signed)
Immediate Anesthesia Transfer of Care Note  Patient: Rachel Chavez  Procedure(s) Performed: Procedure(s) (LRB) with comments: CESAREAN SECTION (N/A)  Patient Location: PACU  Anesthesia Type:Epidural  Level of Consciousness: awake, alert  and oriented  Airway & Oxygen Therapy: Patient Spontanous Breathing  Post-op Assessment: Report given to PACU RN and Post -op Vital signs reviewed and stable  Post vital signs: Reviewed and stable  Complications: No apparent anesthesia complications

## 2012-03-19 NOTE — Progress Notes (Signed)
UR chart review completed.  

## 2012-03-19 NOTE — Anesthesia Postprocedure Evaluation (Signed)
  Anesthesia Post-op Note  Patient: Rachel Chavez  Procedure(s) Performed: Procedure(s) (LRB) with comments: CESAREAN SECTION (N/A)  Patient Location: Mother/Baby  Anesthesia Type:Epidural  Level of Consciousness: awake  Airway and Oxygen Therapy: Patient Spontanous Breathing  Post-op Pain: mild  Post-op Assessment: Patient's Cardiovascular Status Stable and Respiratory Function Stable  Post-op Vital Signs: stable  Complications: No apparent anesthesia complications

## 2012-03-19 NOTE — Progress Notes (Signed)
Family frustrated with care, called Dr. Tamela Oddi to report, no cervical change, uc pattern, pitocin rate, vs.  States Dr. Clearance Coots will be in shortly to talk with pt.

## 2012-03-19 NOTE — Progress Notes (Signed)
Aranza Frost is a 27 y.o. G1P0 at [redacted]w[redacted]d by LMP admitted for rupture of membranes  Subjective:   Objective: BP 117/68  Pulse 95  Temp 98.9 F (37.2 C) (Axillary)  Resp 18  Ht 5\' 4"  (1.626 m)  Wt 156 lb 12.8 oz (71.124 kg)  BMI 26.91 kg/m2  SpO2 99% I/O last 3 completed shifts: In: -  Out: 2600 [Urine:2600]    FHT:  FHR: 150-160 bpm, variability: moderate,  accelerations:  Present,  decelerations:  Absent UC:   regular, every 3-4 minutes SVE:   Dilation: 5 Effacement (%): 80 Station: -1 Exam by:: B.Cagna,RN  Labs: Lab Results  Component Value Date   WBC 11.0* 03/17/2012   HGB 13.2 03/17/2012   HCT 38.9 03/17/2012   MCV 80.2 03/17/2012   PLT 196 03/17/2012    Assessment / Plan: Arrest in active phase of labor  Labor: Active Phase.  Arrest of dilatation. Preeclampsia:  n/a Fetal Wellbeing:  Category I Pain Control:  Epidural I/D:  n/a Anticipated MOD:  Cesarean Section.  Yonna Alwin A 03/19/2012, 8:33 AM

## 2012-03-19 NOTE — Addendum Note (Signed)
Addendum  created 03/19/12 1540 by Renford Dills, CRNA   Modules edited:Notes Section

## 2012-03-20 LAB — CBC
MCV: 81.3 fL (ref 78.0–100.0)
Platelets: 144 10*3/uL — ABNORMAL LOW (ref 150–400)
RBC: 3.16 MIL/uL — ABNORMAL LOW (ref 3.87–5.11)
WBC: 12 10*3/uL — ABNORMAL HIGH (ref 4.0–10.5)

## 2012-03-20 MED ORDER — CLINDAMYCIN PHOSPHATE 900 MG/50ML IV SOLN
900.0000 mg | Freq: Three times a day (TID) | INTRAVENOUS | Status: DC
  Administered 2012-03-20: 900 mg via INTRAVENOUS
  Filled 2012-03-20 (×2): qty 50

## 2012-03-20 MED ORDER — DEXTROSE 5 % IV SOLN
Freq: Three times a day (TID) | INTRAVENOUS | Status: DC
  Administered 2012-03-20 – 2012-03-22 (×6): via INTRAVENOUS
  Filled 2012-03-20 (×6): qty 3.75

## 2012-03-20 NOTE — Progress Notes (Signed)
Subjective: Postpartum Day 1: Cesarean Delivery Patient reports no complaints.  Objective: Vital signs in last 24 hours: Temp:  [97.3 F (36.3 C)-100.3 F (37.9 C)] 98 F (36.7 C) (01/22 0415) Pulse Rate:  [77-122] 81  (01/22 0415) Resp:  [16-24] 18  (01/22 0415) BP: (93-125)/(47-80) 93/60 mmHg (01/22 0415) SpO2:  [96 %-100 %] 98 % (01/22 0415)  Physical Exam:  General: alert and no distress Lochia: appropriate Uterine Fundus: firm Incision: healing well DVT Evaluation: No evidence of DVT seen on physical exam.   Basename 03/17/12 2215  HGB 13.2  HCT 38.9    Assessment/Plan: Status post Cesarean section. Doing well postoperatively.  Anemia.  Clinically stable. Continue current care.  HARPER,CHARLES A 03/20/2012, 7:25 AM

## 2012-03-21 ENCOUNTER — Encounter (HOSPITAL_COMMUNITY): Payer: Self-pay | Admitting: Obstetrics

## 2012-03-21 LAB — CREATININE, SERUM
GFR calc Af Amer: >90 mL/min (ref >90)
GFR calc non Af Amer: >90 mL/min (ref >90)

## 2012-03-21 LAB — GENTAMICIN LEVEL, TROUGH: Gentamicin Trough: 1 ug/mL (ref 0.5–2.0)

## 2012-03-21 NOTE — Progress Notes (Signed)
Subjective: Postpartum Day 2: Cesarean Delivery Patient reports incisional pain.    Objective: Vital signs in last 24 hours: Temp:  [97.3 F (36.3 C)-98.2 F (36.8 C)] 97.3 F (36.3 C) (01/23 0538) Pulse Rate:  [65-105] 90  (01/23 0538) Resp:  [18] 18  (01/23 0538) BP: (97-111)/(65-69) 97/67 mmHg (01/23 0538)  Physical Exam:  General: alert and no distress Lochia: appropriate Uterine Fundus: firm, tender Incision: healing well DVT Evaluation: No evidence of DVT seen on physical exam.   Basename 03/20/12 0709  HGB 8.6*  HCT 25.7*    Assessment/Plan: Status post Cesarean section. Doing well postoperatively.                                                                                                                                                                                                                                                                                                                                                                            Mild endometritis, resolving.  Continue antibiotics. Anemia.  Clinically stable.  Iron started. Continue current care.  Craig Ionescu A 03/21/2012, 8:17 AM

## 2012-03-21 NOTE — Progress Notes (Signed)
ANTIBIOTIC CONSULT NOTE - FOLLOW-UP  27 y.o. female G1P1001 S/P C/S at [redacted]w[redacted]d started on ampicillin, gentamicin, and clindamycin for maternal fever/chorioamnionitis and continued postpartum for endometritis. Now pt is PPD#2 and D#3 of antibiotics, afebrile and resolving endometritis. Gentamicin trough and Scr were check today to ensure no toxicity.   Gentamicin trough = 1.0 and Scr = 0.51.   Plan:  Continue current dose of gentamicin. Will defer checking more levels since patient is clinically improving. Will follow-up with MD on planned duration of antibiotics.  Michelene Heady [facility] 03/21/2012 2:12 PM

## 2012-03-22 MED ORDER — AMOXICILLIN-POT CLAVULANATE 875-125 MG PO TABS: 1.0000 | ORAL_TABLET | Freq: Two times a day (BID) | ORAL | Status: AC

## 2012-03-22 MED ORDER — FUSION PLUS PO CAPS: 1.0000 | ORAL_CAPSULE | Freq: Every day | ORAL | Status: AC

## 2012-03-22 MED ORDER — IBUPROFEN 600 MG PO TABS: 600.0000 mg | ORAL_TABLET | Freq: Four times a day (QID) | ORAL | Status: AC | PRN

## 2012-03-22 MED ORDER — PANTOPRAZOLE SODIUM 40 MG PO TBEC: 40.0000 mg | DELAYED_RELEASE_TABLET | Freq: Every day | ORAL | Status: AC | PRN

## 2012-03-22 MED ORDER — OXYCODONE-ACETAMINOPHEN 5-325 MG PO TABS: 1.0000 | ORAL_TABLET | ORAL | Status: AC | PRN

## 2012-03-22 MED ORDER — IRON POLYSACCH CMPLX-B12-FA 150-0.025-1 MG PO CAPS: 1.0000 | ORAL_CAPSULE | Freq: Every day | ORAL | Status: DC

## 2012-03-22 NOTE — Discharge Summary (Signed)
Obstetric Discharge Summary Reason for Admission: induction of labor Prenatal Procedures: ultrasound Intrapartum Procedures: cesarean: low cervical, transverse Postpartum Procedures: antibiotics Complications-Operative and Postpartum: Endometritis. Hemoglobin  Date Value Range Status  03/20/2012 8.6* 12.0 - 15.0 g/dL Final     HCT  Date Value Range Status  03/20/2012 25.7* 36.0 - 46.0 % Final    Physical Exam:  General: alert and no distress Lochia: appropriate Uterine Fundus: firm Incision: healing well DVT Evaluation: No evidence of DVT seen on physical exam.  Discharge Diagnoses: Term Pregnancy-delivered  Discharge Information: Date: 03/22/2012 Activity: pelvic rest Diet: routine Medications: PNV, Ibuprofen, Colace, Iron, Percocet and Augmentin Condition: stable Instructions: refer to practice specific booklet Discharge to: home Follow-up Information    Follow up with HARPER,CHARLES A, MD. Schedule an appointment as soon as possible for a visit in 2 weeks.   Contact information:   [address] [place] [address] [phone]          Newborn Data: Live born female  Birth Weight: 8 lb (3630 g) APGAR: 9, 9  Home with mother.  HARPER,CHARLES A 03/22/2012, 7:41 AM

## 2012-03-22 NOTE — Progress Notes (Signed)
Subjective: Postpartum Day 3: Cesarean Delivery Patient reports less abdominal tenderness.   Objective: Vital signs in last 24 hours: Temp:  [97.9 F (36.6 C)-99.1 F (37.3 C)] 97.9 F (36.6 C) (01/24 0542) Pulse Rate:  [87-108] 96  (01/24 0542) Resp:  [16-18] 16  (01/24 0542) BP: (105-118)/(65-73) 118/73 mmHg (01/24 0542)  Physical Exam:  General: alert and no distress Lochia: appropriate Uterine Fundus: firm, less tender Incision: healing well DVT Evaluation: No evidence of DVT seen on physical exam.   Basename 03/20/12 0709  HGB 8.6*  HCT 25.7*    Assessment/Plan: Status post Cesarean section. Doing well postoperatively.  Endometritis.  Resolving.  Will discharge home on p o antibiotic x 7 days. Anemia.  Clinically stable.  Iron Rx. Discharge home with standard precautions and return to clinic in 2 weeks.  Javana Schey A 03/22/2012, 6:58 AM

## 2013-12-29 ENCOUNTER — Encounter (HOSPITAL_COMMUNITY): Payer: Self-pay | Admitting: Obstetrics

## 2014-04-12 ENCOUNTER — Encounter (HOSPITAL_COMMUNITY): Payer: Self-pay | Admitting: Emergency Medicine

## 2014-04-12 ENCOUNTER — Emergency Department (HOSPITAL_COMMUNITY): Payer: Medicaid Other

## 2014-04-12 ENCOUNTER — Emergency Department (HOSPITAL_COMMUNITY)
Admission: EM | Admit: 2014-04-12 | Discharge: 2014-04-12 | Disposition: A | Discharge status: Home / Self Care | Payer: Medicaid Other | Attending: Emergency Medicine | Admitting: Emergency Medicine

## 2014-04-12 DIAGNOSIS — Z792 Long term (current) use of antibiotics: Secondary | ICD-10-CM | POA: Insufficient documentation

## 2014-04-12 DIAGNOSIS — J111 Influenza due to unidentified influenza virus with other respiratory manifestations: Secondary | ICD-10-CM | POA: Insufficient documentation

## 2014-04-12 DIAGNOSIS — R000 Tachycardia, unspecified: Secondary | ICD-10-CM | POA: Insufficient documentation

## 2014-04-12 DIAGNOSIS — Z79899 Other long term (current) drug therapy: Secondary | ICD-10-CM | POA: Insufficient documentation

## 2014-04-12 DIAGNOSIS — R69 Illness, unspecified: Secondary | ICD-10-CM

## 2014-04-12 LAB — CBC WITH DIFFERENTIAL/PLATELET
BASOS ABS: 0 10*3/uL (ref 0.0–0.1)
Basophils Relative: 0 % (ref 0–1)
Eosinophils Absolute: 0.2 10*3/uL (ref 0.0–0.7)
Eosinophils Relative: 2 % (ref 0–5)
HCT: 38.7 % (ref 36.0–46.0)
Hemoglobin: 12.8 g/dL (ref 12.0–15.0)
LYMPHS PCT: 6 % — AB (ref 12–46)
Lymphs Abs: 0.5 10*3/uL — ABNORMAL LOW (ref 0.7–4.0)
MCH: 26.3 pg (ref 26.0–34.0)
MCHC: 33.1 g/dL (ref 30.0–36.0)
MCV: 79.5 fL (ref 78.0–100.0)
MONO ABS: 0.5 10*3/uL (ref 0.1–1.0)
Monocytes Relative: 5 % (ref 3–12)
NEUTROS PCT: 87 % — AB (ref 43–77)
Neutro Abs: 7.2 10*3/uL (ref 1.7–7.7)
PLATELETS: 263 10*3/uL (ref 150–400)
RBC: 4.87 MIL/uL (ref 3.87–5.11)
RDW: 13.7 % (ref 11.5–15.5)
WBC: 8.3 10*3/uL (ref 4.0–10.5)

## 2014-04-12 LAB — I-STAT CHEM 8, ED
BUN: 4 mg/dL — ABNORMAL LOW (ref 6–23)
CALCIUM ION: 1.15 mmol/L (ref 1.12–1.23)
CREATININE: 0.6 mg/dL (ref 0.50–1.10)
Chloride: 104 mmol/L (ref 96–112)
Glucose, Bld: 141 mg/dL — ABNORMAL HIGH (ref 70–99)
HCT: 42 % (ref 36.0–46.0)
HEMOGLOBIN: 14.3 g/dL (ref 12.0–15.0)
Potassium: 3.4 mmol/L — ABNORMAL LOW (ref 3.5–5.1)
SODIUM: 138 mmol/L (ref 135–145)
TCO2: 17 mmol/L (ref 0–100)

## 2014-04-12 MED ORDER — POTASSIUM CHLORIDE CRYS ER 20 MEQ PO TBCR
40.0000 meq | EXTENDED_RELEASE_TABLET | Freq: Once | ORAL | Status: AC
  Administered 2014-04-12: 40 meq via ORAL
  Filled 2014-04-12: qty 2

## 2014-04-12 NOTE — ED Notes (Signed)
Patient with fever and cough since yesterday.  Patient states that her husband started with symptoms on Friday and now she has them.

## 2014-04-12 NOTE — ED Provider Notes (Signed)
CSN: [id number]     Arrival date & time 04/12/14  2007 History   First MD Initiated Contact with Patient 04/12/14 2035     Chief Complaint  Patient presents with  . Fever  . Cough     (Consider location/radiation/quality/duration/timing/severity/associated sxs/prior Treatment) HPI Complains of nonproductive cough and subjective fever onset yesterday. She treated herself with Tylenol 1.5 hours prior to coming here and feels improved presently. She denies any shortness of breath admits to mild diffuse myalgias. No other associated symptoms. Nothing makes symptoms better or worse. Feels much improved since treatment with Tylenol Past Medical History  Diagnosis Date  . No pertinent past medical history    Past Surgical History  Procedure Laterality Date  . No past surgeries    . Cesarean section  03/19/2012    Procedure: CESAREAN SECTION;  Surgeon: Brock Bad, MD;  Location: [facility];  Service: Obstetrics;  Laterality: N/A;   Family History  Problem Relation Age of Onset  . Other Neg Hx    History  Substance Use Topics  . Smoking status: Never Smoker   . Smokeless tobacco: Never Used  . Alcohol Use: No   OB History    Gravida Para Term Preterm AB TAB SAB Ectopic Multiple Living   Review of Systems  Constitutional: Positive for fever.  HENT: Negative.   Respiratory: Positive for cough.   Cardiovascular: Negative.   Gastrointestinal: Negative.   Musculoskeletal: Negative.   Skin: Negative.   Neurological: Negative.   Psychiatric/Behavioral: Negative.   All other systems reviewed and are negative.     Allergies  Review of patient's allergies indicates no known allergies.  Home Medications   Prior to Admission medications   Medication Sig Start Date End Date Taking? Authorizing Provider  amoxicillin-clavulanate (AUGMENTIN) 875-125 MG per tablet Take 1 tablet by mouth 2 (two) times daily. 03/22/12   Brock Bad, MD  ibuprofen (ADVIL,MOTRIN)  600 MG tablet Take 1 tablet (600 mg total) by mouth every 6 (six) hours as needed for pain. 03/22/12   Brock Bad, MD  Iron-FA-B Cmp-C-Biot-Probiotic (FUSION PLUS) CAPS Take 1 capsule by mouth daily before breakfast. 03/22/12   Brock Bad, MD  oxyCODONE-acetaminophen (PERCOCET/ROXICET) 5-325 MG per tablet Take 1-2 tablets by mouth every 4 (four) hours as needed for pain (moderate - severe pain). 03/22/12   Brock Bad, MD  pantoprazole (PROTONIX) 40 MG tablet Take 1 tablet (40 mg total) by mouth daily as needed. 03/22/12   Brock Bad, MD  Prenatal Vit-Fe Fumarate-FA (PRENATAL MULTIVITAMIN) TABS Take 1 tablet by mouth every morning.    Historical Provider, MD   BP 103/56 mmHg  Pulse 130  Temp(Src) 100.3 F (37.9 C) (Oral)  Resp 18  Ht  (1.6 m)  Wt 142 lb 9.6 oz (64.683 kg)  BMI 25.27 kg/m2  SpO2 98%  LMP 03/10/2014 (Approximate) Physical Exam  Constitutional: She appears well-developed and well-nourished.  HENT:  Head: Normocephalic and atraumatic.  Eyes: Conjunctivae are normal. Pupils are equal, round, and reactive to light.  Neck: Neck supple. No tracheal deviation present. No thyromegaly present.  Cardiovascular: Regular rhythm.   No murmur heard. Pulse mildly tachycardic counted at 116 by me  Pulmonary/Chest: Effort normal and breath sounds normal.  Abdominal: Soft. Bowel sounds are normal. She exhibits no distension. There is no tenderness.  Musculoskeletal: Normal range of motion. She exhibits no edema or  tenderness.  Neurological: She is alert. Coordination normal.  Skin: Skin is warm and dry. No rash noted.  Psychiatric: She has a normal mood and affect.  Nursing note and vitals reviewed.   ED Course  Procedures (including critical care time) Labs Review Labs Reviewed  I-STAT CHEM 8, ED - Abnormal; Notable for the following:    Potassium 3.4 (*)    BUN 4 (*)    Glucose, Bld 141 (*)    All other components within normal limits  CBC WITH  DIFFERENTIAL/PLATELET    Imaging Review No results found.   EKG Interpretation None     Chest x-ray viewed by me Results for orders placed or performed during the hospital encounter of 04/12/14  CBC with Differential  Result Value Ref Range   WBC 8.3 4.0 - 10.5 K/uL   RBC 4.87 3.87 - 5.11 MIL/uL   Hemoglobin 12.8 12.0 - 15.0 g/dL   HCT 16.138.7 09.636.0 - 04.546.0 %   MCV 79.5 78.0 - 100.0 fL   MCH 26.3 26.0 - 34.0 pg   MCHC 33.1 30.0 - 36.0 g/dL   RDW 40.913.7 81.111.5 - 91.415.5 %   Platelets 263 150 - 400 K/uL   Neutrophils Relative % 87 (H) 43 - 77 %   Neutro Abs 7.2 1.7 - 7.7 K/uL   Lymphocytes Relative 6 (L) 12 - 46 %   Lymphs Abs 0.5 (L) 0.7 - 4.0 K/uL   Monocytes Relative 5 3 - 12 %   Monocytes Absolute 0.5 0.1 - 1.0 K/uL   Eosinophils Relative 2 0 - 5 %   Eosinophils Absolute 0.2 0.0 - 0.7 K/uL   Basophils Relative 0 0 - 1 %   Basophils Absolute 0.0 0.0 - 0.1 K/uL  I-Stat Chem 8, ED  Result Value Ref Range   Sodium 138 135 - 145 mmol/L   Potassium 3.4 (L) 3.5 - 5.1 mmol/L   Chloride 104 96 - 112 mmol/L   BUN 4 (L) 6 - 23 mg/dL   Creatinine, Ser 7.820.60 0.50 - 1.10 mg/dL   Glucose, Bld 956141 (H) 70 - 99 mg/dL   Calcium, Ion 2.131.15 0.861.12 - 1.23 mmol/L   TCO2 17 0 - 100 mmol/L   Hemoglobin 14.3 12.0 - 15.0 g/dL   HCT 57.842.0 46.936.0 - 62.946.0 %   Dg Chest 2 View  04/12/2014   CLINICAL DATA:  Fever and cough  EXAM: CHEST  2 VIEW  COMPARISON:  None.  FINDINGS: Lungs are clear. Heart size and pulmonary vascularity are normal. No adenopathy. No bone lesions.  IMPRESSION: No edema or consolidation.   Electronically Signed   By: Bretta BangWilliam  Woodruff III M.D.   On: 04/12/2014 20:51    MDM  Plan Tylenol for discomfort or temperature higher than 100.4. Referral [facility] Diagnosis #1 influenza-like illness #2 hypokalemia Final diagnoses:  None        Doug SouSam Zakyah Yanes, MD 04/12/14 2300

## 2014-04-12 NOTE — ED Notes (Signed)
Pt refused last set of vitals

## 2014-04-12 NOTE — Discharge Instructions (Signed)
Take Tylenol as directed every 4 hours for aches or for temperature higher than 100.4 while awake. Use a humidifier to help with congestion. We suspect that your illness will last for approximately week. Call the [facility] if you're not feeling better in a week. Return if your condition worsens for any reason. Make sure that you drink lots of fluids. Drink at least 68 ounce glasses of water or Gatorade daily. Your blood potassium was slightly low tonight. The potassium tablets that you were given should fix that. Bananas and orange juice containing lots of potassium.

## 2016-05-15 IMAGING — CR DG CHEST 2V
2 series · 2 of 2 positions shown · non-contrast
Comparison: None.

CLINICAL DATA: Fever and cough

EXAM:
CHEST  2 VIEW

[w chest pa]
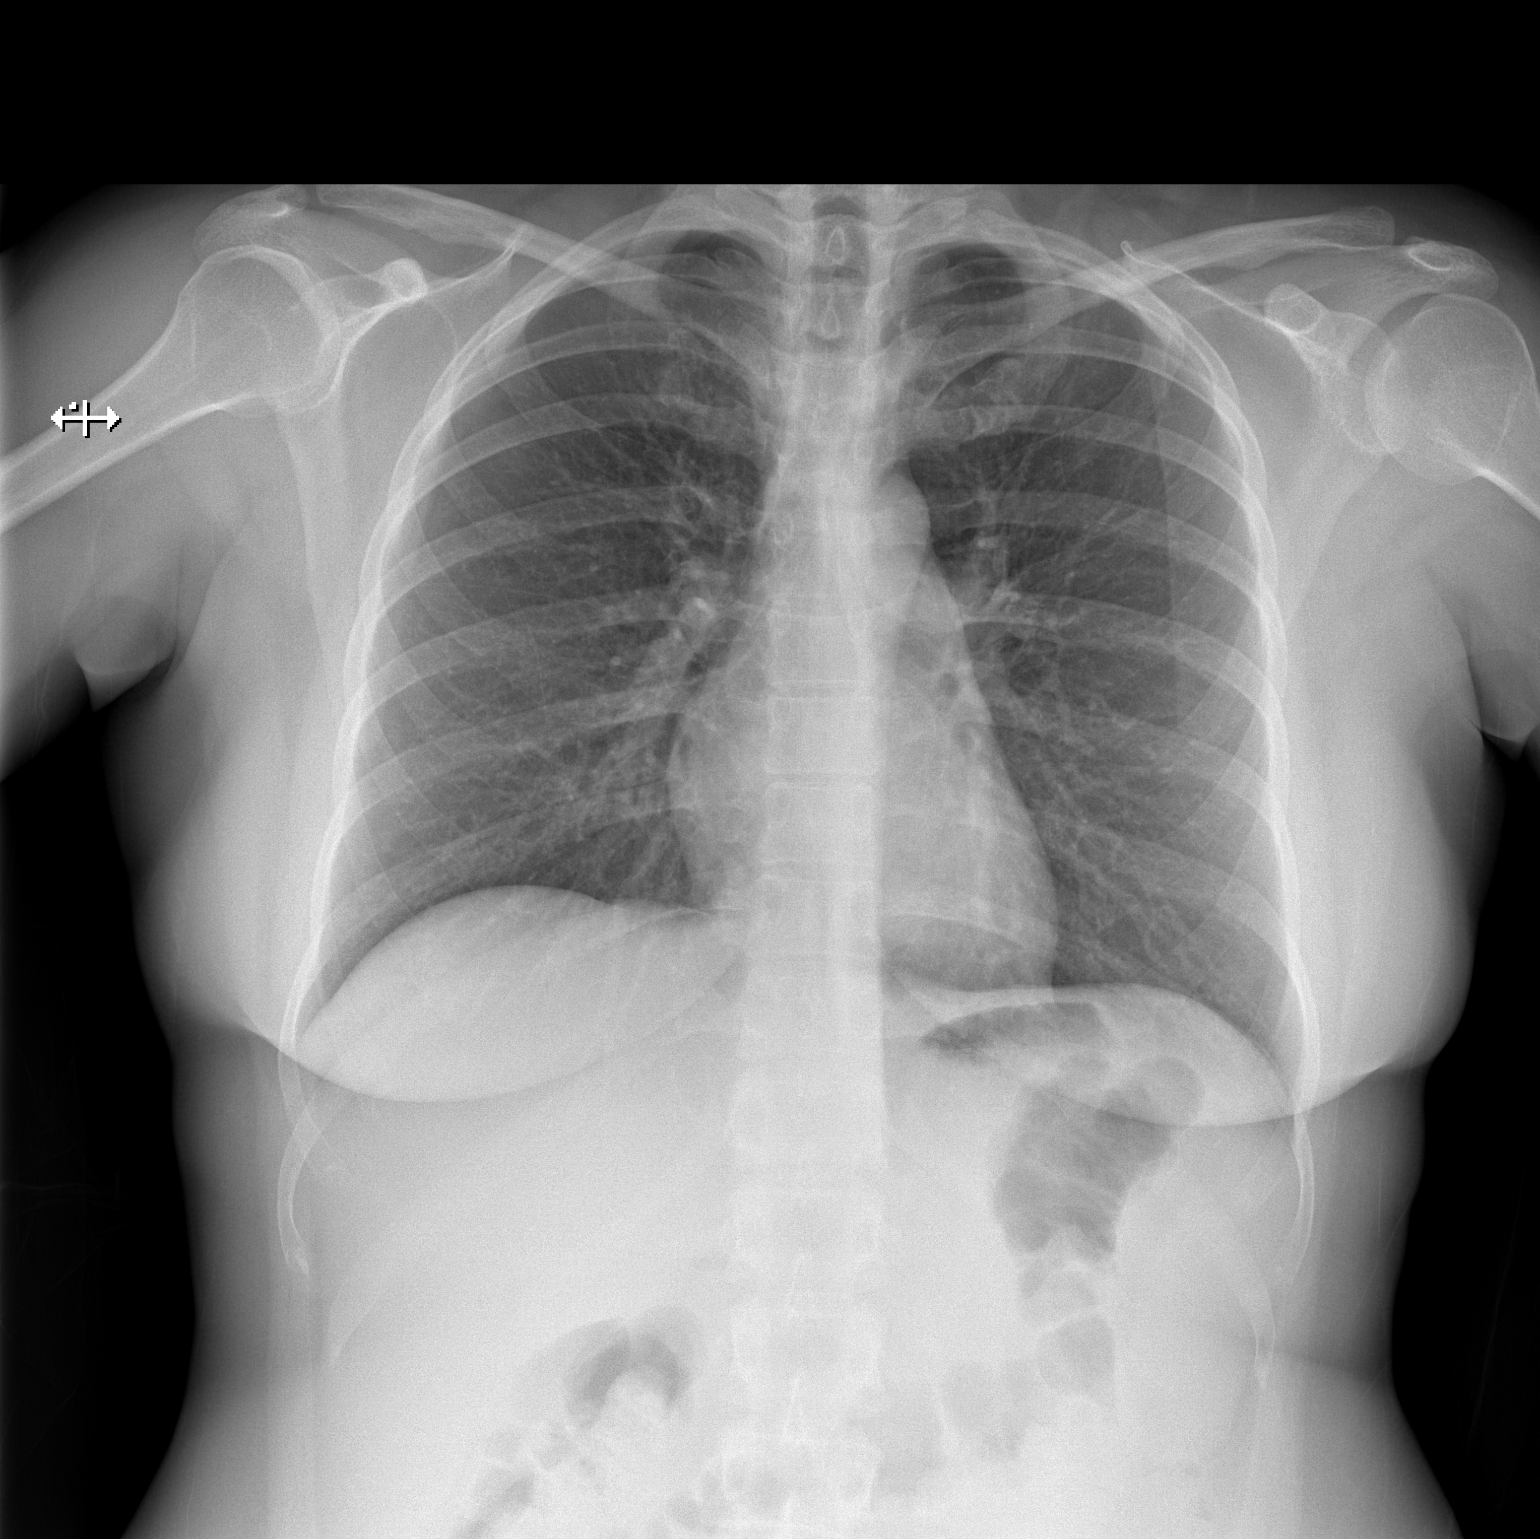

[w chest lat]
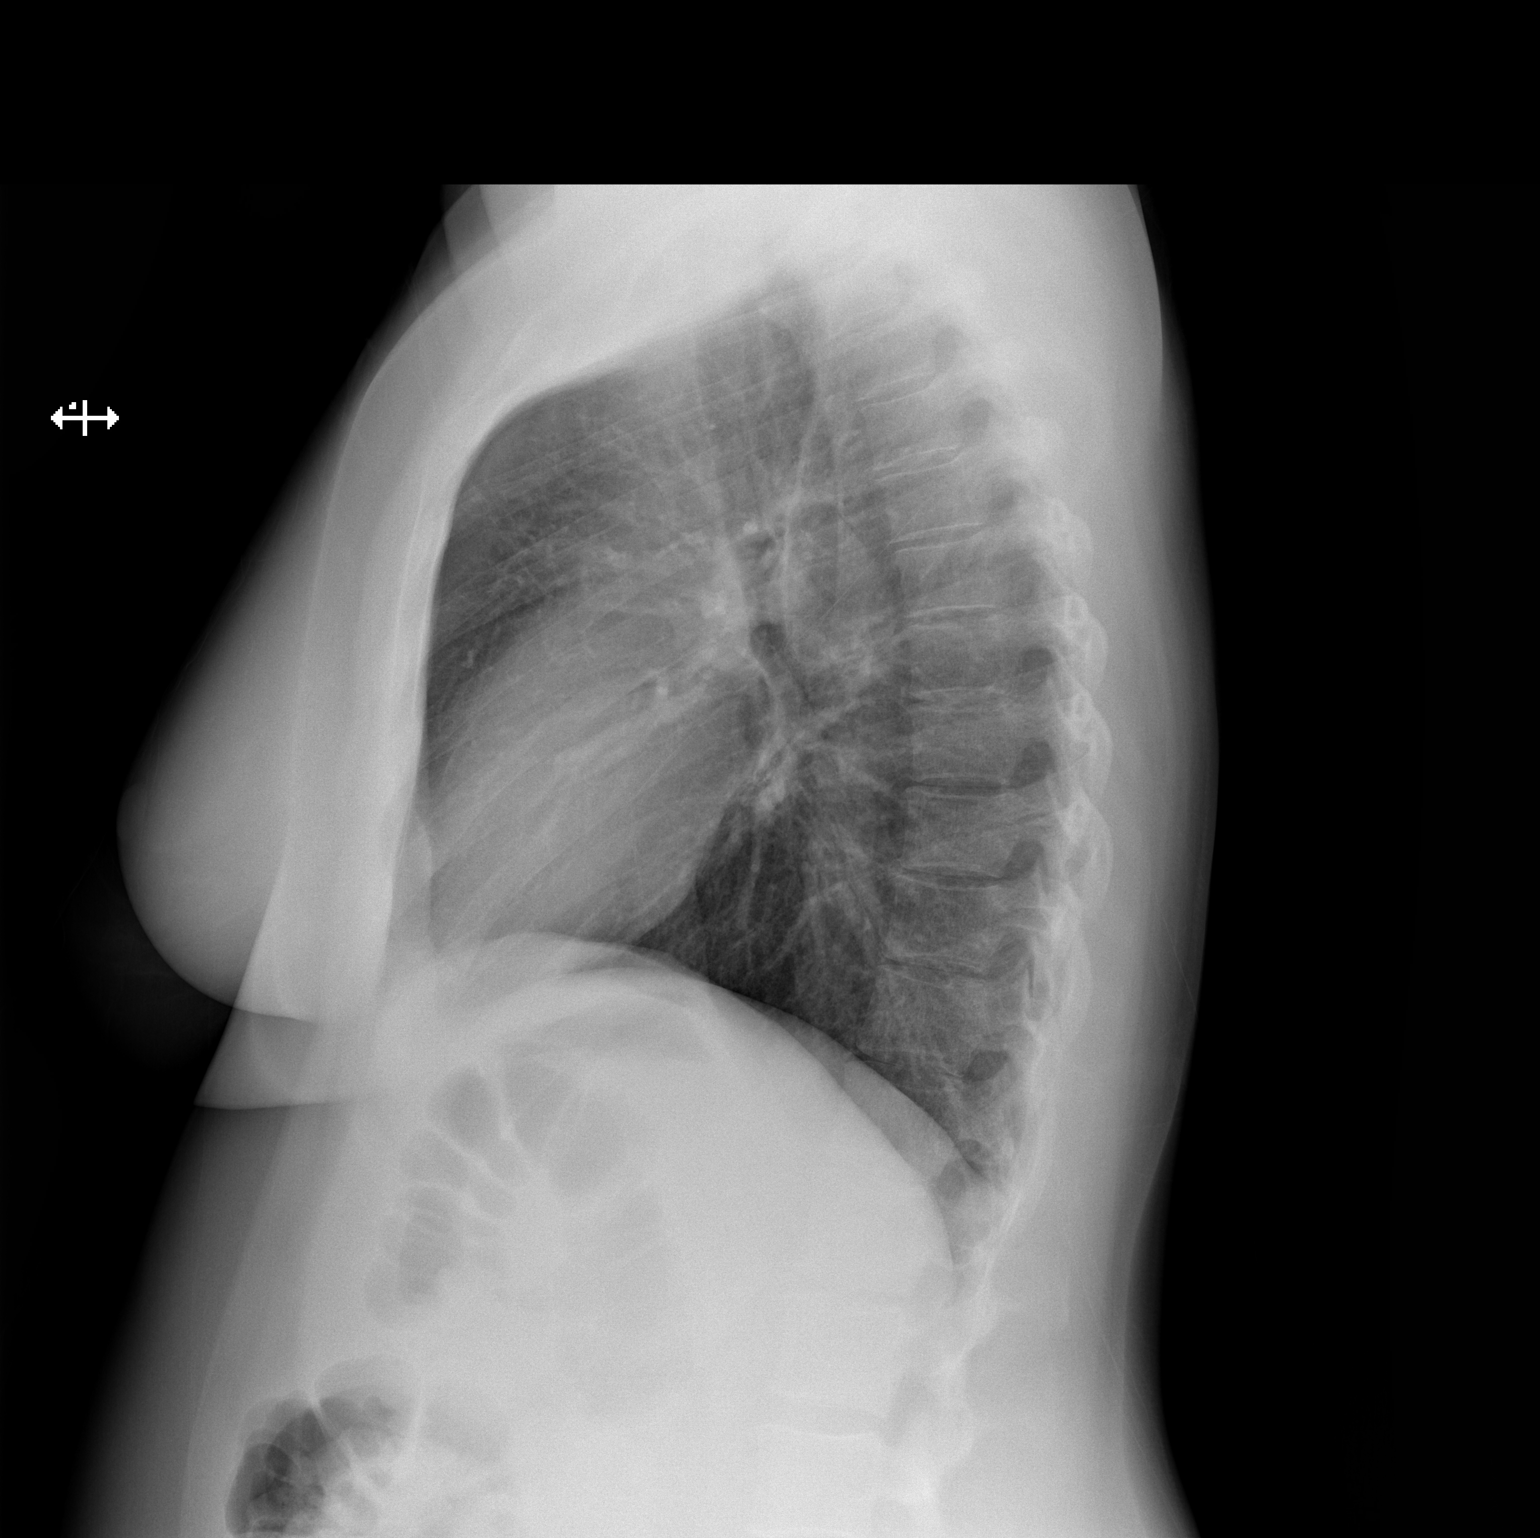

[2 of 2 positions shown; findings below may reference images not displayed]

FINDINGS: Lungs are clear. Heart size and pulmonary vascularity are normal. No
adenopathy. No bone lesions.
IMPRESSION: No edema or consolidation.

## 2020-06-02 ENCOUNTER — Ambulatory Visit: Payer: Self-pay | Admitting: Family Medicine

## 2020-06-29 ENCOUNTER — Ambulatory Visit: Payer: 59 | Admitting: Family Medicine
